# Patient Record
Sex: Male | Born: 1975 | Race: Black or African American | Hispanic: No | Marital: Single | Smoking: Current every day smoker
Health system: Southern US, Community
[De-identification: ages and names within clinical notes are randomized; demographics above are authoritative.]

---

## 2017-11-30 ENCOUNTER — Other Ambulatory Visit: Payer: Self-pay

## 2017-11-30 ENCOUNTER — Emergency Department (HOSPITAL_BASED_OUTPATIENT_CLINIC_OR_DEPARTMENT_OTHER): Payer: Self-pay

## 2017-11-30 ENCOUNTER — Emergency Department (HOSPITAL_BASED_OUTPATIENT_CLINIC_OR_DEPARTMENT_OTHER)
Admission: EM | Admit: 2017-11-30 | Discharge: 2017-11-30 | Disposition: A | Discharge status: Home / Self Care | Payer: Self-pay | Attending: Emergency Medicine | Admitting: Emergency Medicine

## 2017-11-30 ENCOUNTER — Encounter (HOSPITAL_BASED_OUTPATIENT_CLINIC_OR_DEPARTMENT_OTHER): Payer: Self-pay | Admitting: *Deleted

## 2017-11-30 DIAGNOSIS — R05 Cough: Secondary | ICD-10-CM | POA: Insufficient documentation

## 2017-11-30 DIAGNOSIS — J069 Acute upper respiratory infection, unspecified: Secondary | ICD-10-CM | POA: Insufficient documentation

## 2017-11-30 DIAGNOSIS — F17210 Nicotine dependence, cigarettes, uncomplicated: Secondary | ICD-10-CM | POA: Insufficient documentation

## 2017-11-30 NOTE — ED Triage Notes (Signed)
Fever, chills, body aches, headache and fatigue.

## 2017-11-30 NOTE — ED Notes (Signed)
Pt endorses some neck pain, fatigue, decreased appetite, chills, and "like my brain is dry." Pt also reports persistent cough. Pt has had sick contacts at work.

## 2017-11-30 NOTE — ED Provider Notes (Signed)
[facility] EMERGENCY DEPARTMENT Provider Note   CSN: [id number] Arrival date & time: 11/30/17  1533     History   Chief Complaint Chief Complaint  Patient presents with  . Generalized Body Aches    HPI Steve Suarez is a 42 y.o. male without significant past medical history, presenting to the ED with 4 days of generalized myalgias, subjective fever and chills, and dry cough.  States cough is nonproductive, however sounds very congested.  He has taken Mucinex, TheraFlu, and Aleve for symptoms with relief.  Reports positive sick contacts at work.  Denies difficulty breathing or swallowing, history of immunocompromise, or other complaints.  The history is provided by the patient.    History reviewed. No pertinent past medical history.  There are no active problems to display for this patient.   History reviewed. No pertinent surgical history.     Home Medications    Prior to Admission medications   Not on File    Family History No family history on file.  Social History Social History   Tobacco Use  . Smoking status: Current Every Day Smoker  . Smokeless tobacco: Never Used  Substance Use Topics  . Alcohol use: No    Frequency: Never  . Drug use: No     Allergies   Patient has no known allergies.   Review of Systems Review of Systems  Constitutional: Positive for chills and fever (subjective).  HENT: Negative for congestion, ear pain, sore throat, trouble swallowing and voice change.   Respiratory: Positive for cough. Negative for shortness of breath.   Gastrointestinal: Negative for abdominal pain and vomiting.  Musculoskeletal: Positive for myalgias (Generalized).  Allergic/Immunologic: Negative for immunocompromised state.  All other systems reviewed and are negative.    Physical Exam Updated Vital Signs BP 125/78   Pulse 75   Temp 99.8 F (37.7 C) (Oral)   Resp 18   Ht 5\' 10"  (1.778 m)   Wt 69 kg (152 lb 1.9 oz)   SpO2 98%    BMI 21.83 kg/m   Physical Exam  Constitutional: He appears well-developed and well-nourished. No distress.  Well-appearing, tolerating secretions.  HENT:  Head: Normocephalic and atraumatic.  Right Ear: External ear and ear canal normal.  Left Ear: Tympanic membrane, external ear and ear canal normal.  Nose: Nose normal.  Mouth/Throat: Uvula is midline and oropharynx is clear and moist. No trismus in the jaw. No uvula swelling.  Unable to visualize right TM secondary to cerumen.  Eyes: Conjunctivae and EOM are normal. Pupils are equal, round, and reactive to light.  Neck: Normal range of motion. Neck supple.  No rigidity.  Cardiovascular: Normal rate, regular rhythm, normal heart sounds and intact distal pulses.  Pulmonary/Chest: Effort normal and breath sounds normal. No respiratory distress.  Abdominal: Soft. Bowel sounds are normal. He exhibits no distension. There is no tenderness. There is no rebound and no guarding.  Lymphadenopathy:    He has no cervical adenopathy.  Neurological: He is alert.  Skin: Skin is warm.  Psychiatric: He has a normal mood and affect. His behavior is normal.  Nursing note and vitals reviewed.    ED Treatments / Results  Labs (all labs ordered are listed, but only abnormal results are displayed) Labs Reviewed - No data to display  EKG  EKG Interpretation None       Radiology Dg Chest 2 View  Result Date: 11/30/2017 CLINICAL DATA:  Fever chills and body ache EXAM: CHEST  2 VIEW  COMPARISON:  None. FINDINGS: The heart size and mediastinal contours are within normal limits. Both lungs are clear. The visualized skeletal structures are unremarkable. IMPRESSION: No active cardiopulmonary disease. Electronically Signed   By: Jasmine Pang M.D.   On: 11/30/2017 18:13    Procedures Procedures (including critical care time)  Medications Ordered in ED Medications - No data to display   Initial Impression / Assessment and Plan / ED Course  I  have reviewed the triage vital signs and the nursing notes.  Pertinent labs & imaging results that were available during my care of the patient were reviewed by me and considered in my medical decision making (see chart for details).     Patients symptoms are consistent with URI, likely viral etiology. Afebrile in ED, tolerating secretions.  Lungs clear to auscultation bilaterally. CXR negative for acute infiltrate.  Discussed that antibiotics are not indicated for viral infections. Pt will be discharged with symptomatic treatment.  Verbalizes understanding and is agreeable with plan. Pt is hemodynamically stable & in NAD prior to dc.  Discussed results, findings, treatment and follow up. Patient advised of return precautions. Patient verbalized understanding and agreed with plan.  Final Clinical Impressions(s) / ED Diagnoses   Final diagnoses:  Viral URI    ED Discharge Orders    None       Nahun Kronberg, [place] N, PA-C 11/30/17 1836    Pricilla Loveless, MD 12/01/17 0040

## 2017-11-30 NOTE — ED Notes (Signed)
PO fluids encouraged at this time.

## 2017-11-30 NOTE — Discharge Instructions (Signed)
Please read instructions below. You can take tylenol as needed for sore throat or body aches. Drink plenty of water. Follow up with your primary care provider as needed. Return to the ER for difficulty swallowing liquids, difficulty breathing, or new or worsening symptoms. ° °

## 2018-10-28 IMAGING — CR DG CHEST 2V
2 series · 2 of 2 positions shown · non-contrast
Comparison: None.

CLINICAL DATA: Fever chills and body ache

EXAM:
CHEST  2 VIEW

[w chest pa]
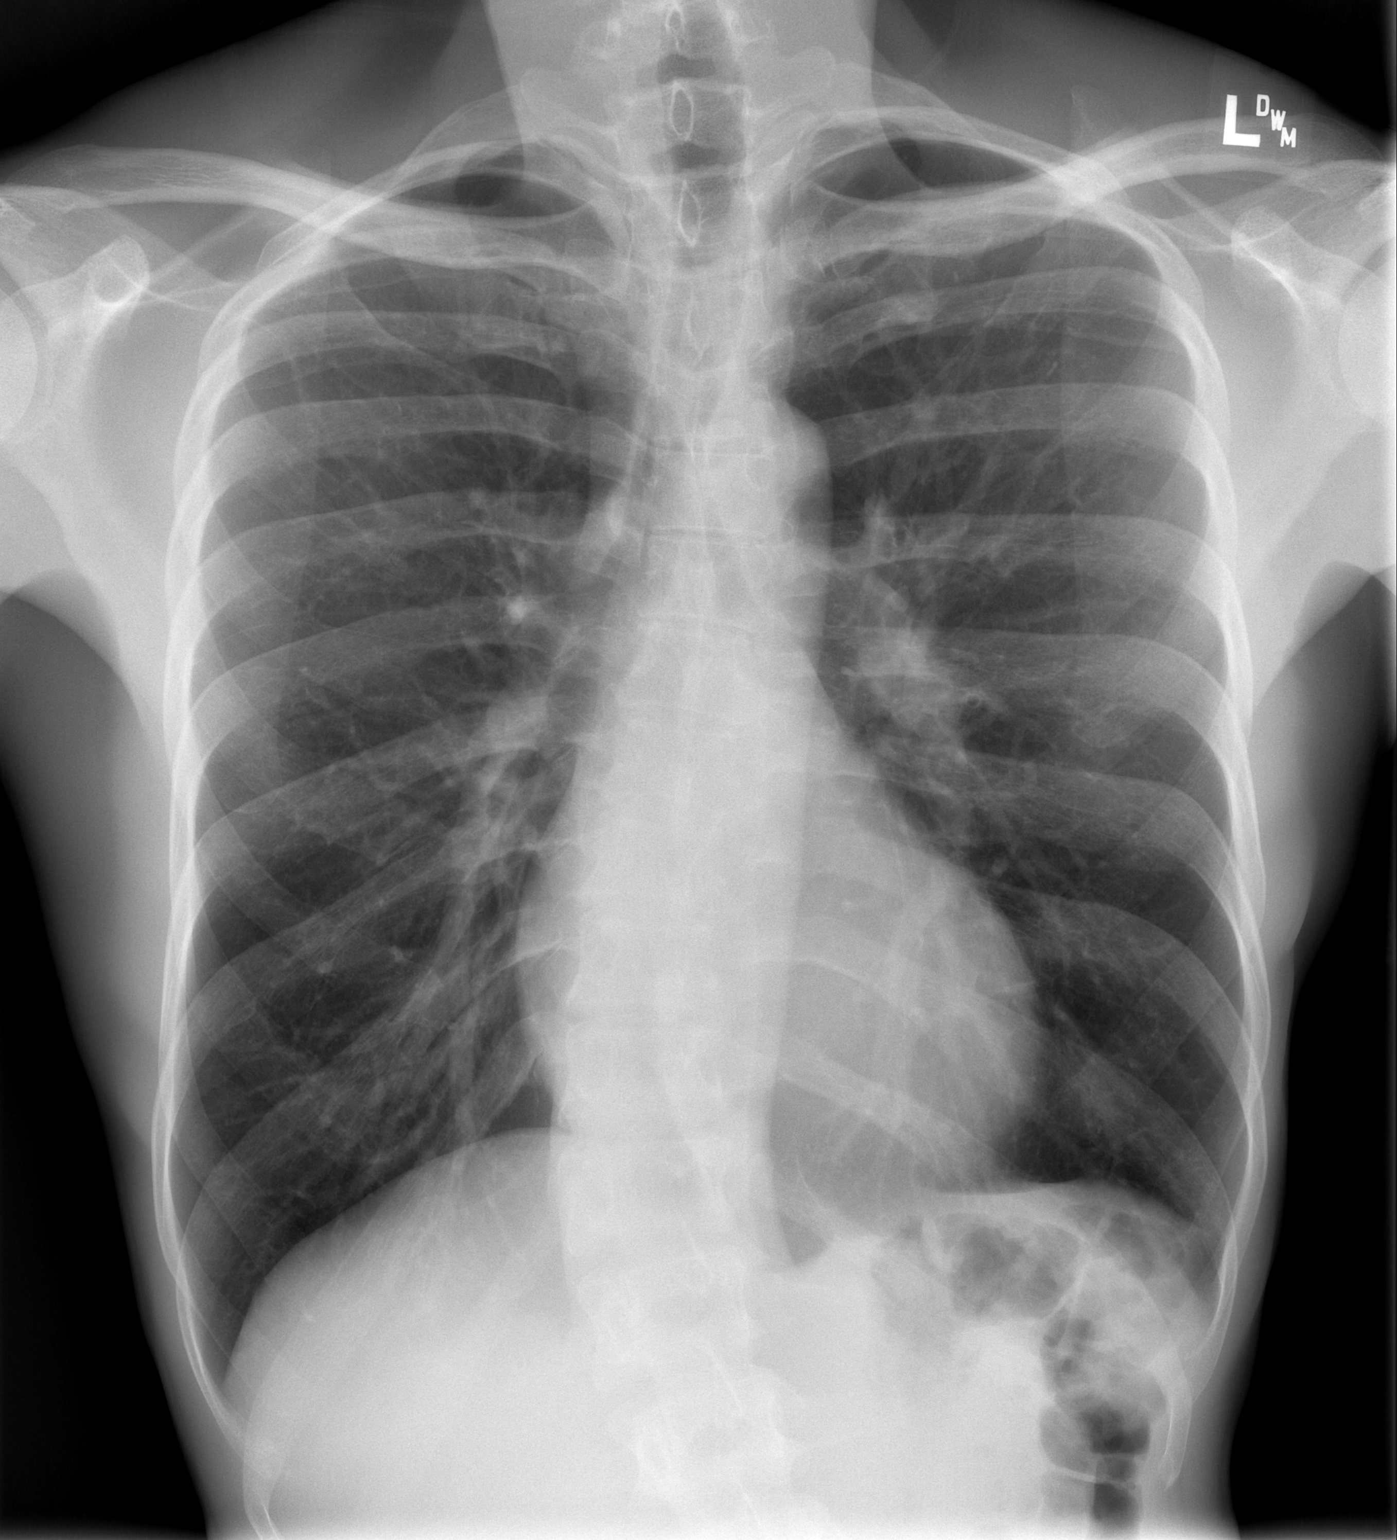

[w chest lat]
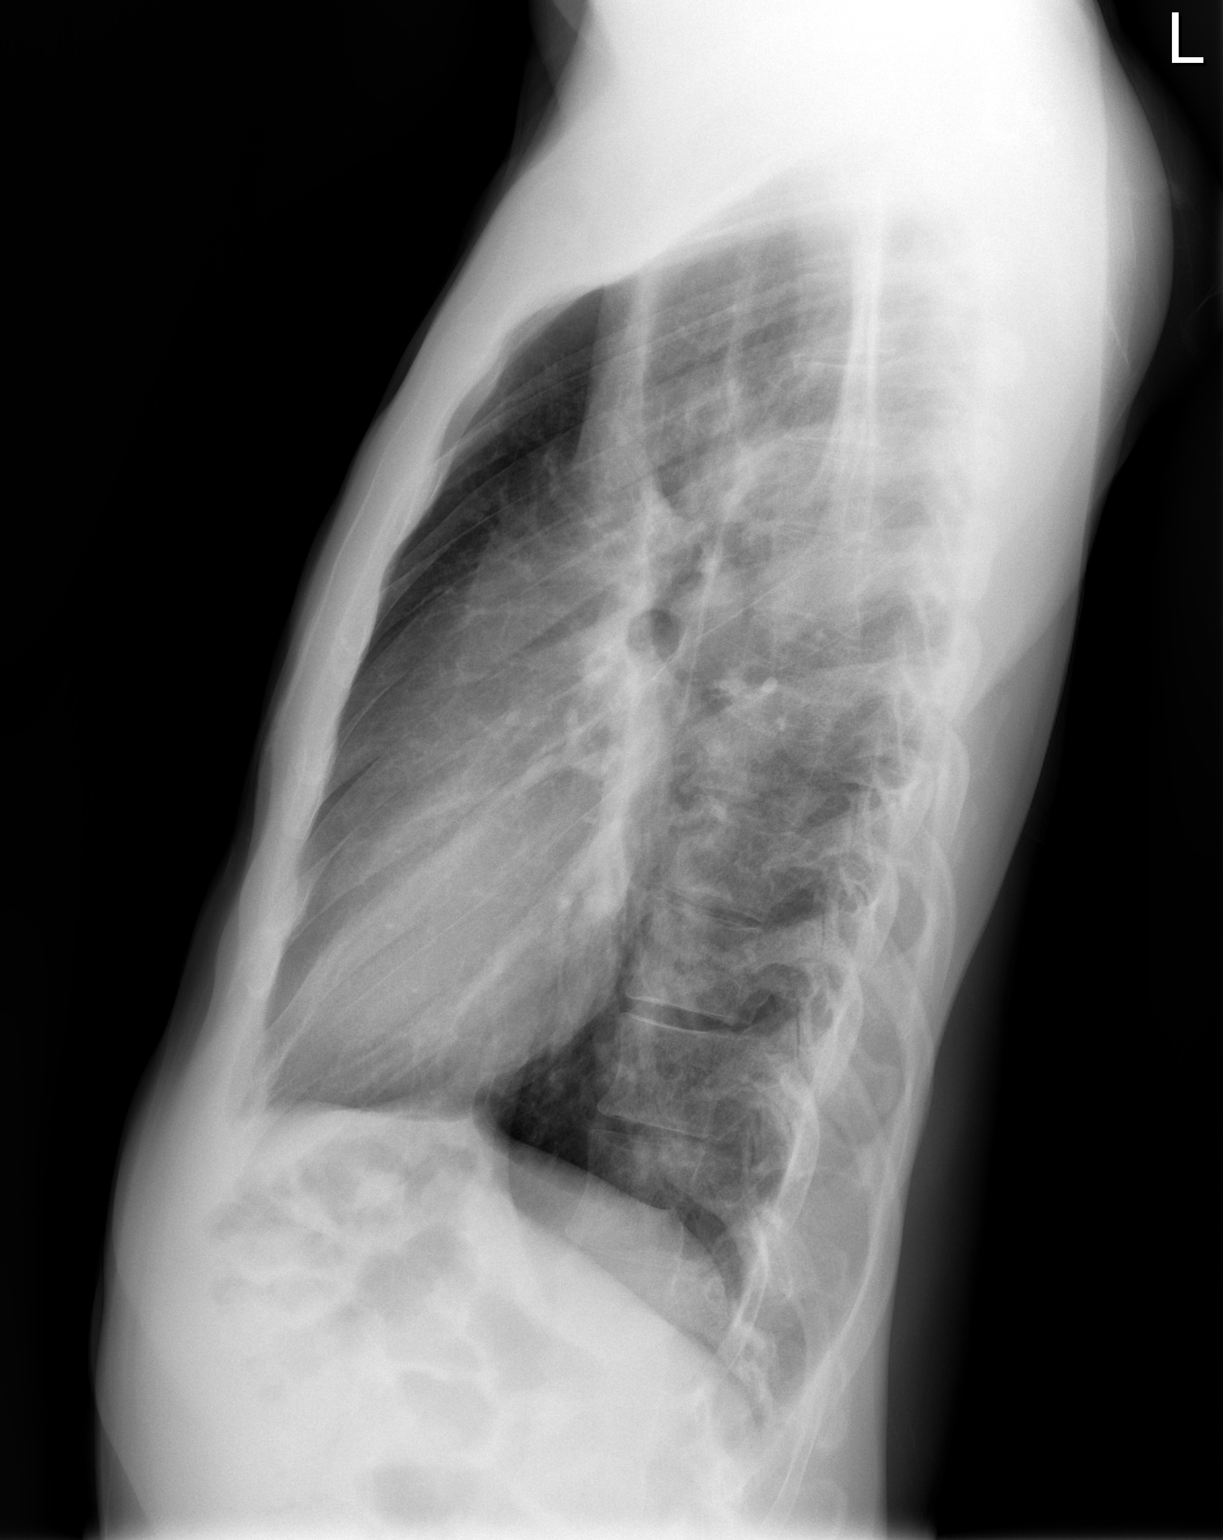

[2 of 2 positions shown; findings below may reference images not displayed]

FINDINGS: The heart size and mediastinal contours are within normal limits.
Both lungs are clear. The visualized skeletal structures are
unremarkable.
IMPRESSION: No active cardiopulmonary disease.

## 2023-09-01 ENCOUNTER — Other Ambulatory Visit: Payer: Self-pay

## 2023-09-01 ENCOUNTER — Emergency Department (HOSPITAL_BASED_OUTPATIENT_CLINIC_OR_DEPARTMENT_OTHER): Payer: No Typology Code available for payment source

## 2023-09-01 ENCOUNTER — Encounter (HOSPITAL_BASED_OUTPATIENT_CLINIC_OR_DEPARTMENT_OTHER): Payer: Self-pay | Admitting: Urology

## 2023-09-01 ENCOUNTER — Emergency Department (HOSPITAL_BASED_OUTPATIENT_CLINIC_OR_DEPARTMENT_OTHER)
Admission: EM | Admit: 2023-09-01 | Discharge: 2023-09-01 | Disposition: A | Discharge status: Home / Self Care | Payer: No Typology Code available for payment source | Attending: Emergency Medicine | Admitting: Emergency Medicine

## 2023-09-01 DIAGNOSIS — S76011A Strain of muscle, fascia and tendon of right hip, initial encounter: Secondary | ICD-10-CM | POA: Insufficient documentation

## 2023-09-01 DIAGNOSIS — S0990XA Unspecified injury of head, initial encounter: Secondary | ICD-10-CM | POA: Diagnosis present

## 2023-09-01 DIAGNOSIS — Y92410 Unspecified street and highway as the place of occurrence of the external cause: Secondary | ICD-10-CM | POA: Diagnosis not present

## 2023-09-01 DIAGNOSIS — S46911A Strain of unspecified muscle, fascia and tendon at shoulder and upper arm level, right arm, initial encounter: Secondary | ICD-10-CM | POA: Insufficient documentation

## 2023-09-01 DIAGNOSIS — S161XXA Strain of muscle, fascia and tendon at neck level, initial encounter: Secondary | ICD-10-CM | POA: Insufficient documentation

## 2023-09-01 MED ORDER — METHOCARBAMOL 500 MG PO TABS: 500.0000 mg | ORAL_TABLET | Freq: Three times a day (TID) | ORAL | 0 refills | Status: AC | PRN

## 2023-09-01 NOTE — Discharge Instructions (Signed)
We believe that your symptoms are caused by musculoskeletal strain.  Please read through the included information about additional care such as heating pads, over-the-counter pain medicine.  If you were provided a prescription please use it only as needed and as instructed.  Remember that early mobility and using the affected part of your body is actually better than keeping it immobile.  Follow-up with the doctor listed as recommended or return to the emergency department with new or worsening symptoms that concern you.
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
